# Patient Record
Sex: Male | Born: 1980 | Race: White | Hispanic: No | Marital: Married | Smoking: Never smoker
Health system: Southern US, Community
[De-identification: ages and names within clinical notes are randomized; demographics above are authoritative.]

## PROBLEM LIST (undated history)

## (undated) DIAGNOSIS — I499 Cardiac arrhythmia, unspecified: Secondary | ICD-10-CM

## (undated) DIAGNOSIS — I471 Supraventricular tachycardia, unspecified: Secondary | ICD-10-CM

## (undated) DIAGNOSIS — E785 Hyperlipidemia, unspecified: Secondary | ICD-10-CM

## (undated) HISTORY — DX: Cardiac arrhythmia, unspecified: I49.9

## (undated) HISTORY — PX: SVT ABLATION: EP1225

## (undated) HISTORY — DX: Supraventricular tachycardia, unspecified: I47.10

## (undated) HISTORY — DX: Hyperlipidemia, unspecified: E78.5

---

## 2009-08-08 ENCOUNTER — Observation Stay: Payer: Self-pay | Admitting: Internal Medicine

## 2013-06-28 ENCOUNTER — Ambulatory Visit: Payer: Self-pay | Admitting: Physician Assistant

## 2014-10-28 IMAGING — US US GALLBLADDER-BILIARY (RUQ)
1 series · 14 of 25 positions shown · non-contrast
Comparison: None

CLINICAL DATA: Abdominal pain, attention gallbladder

EXAM:
US ABDOMEN LIMITED - RIGHT UPPER QUADRANT

[Series 1: us gallbladder-biliary (ruq) · 0.31mm/px · 14 of 45 slices shown]
[im 1/45]
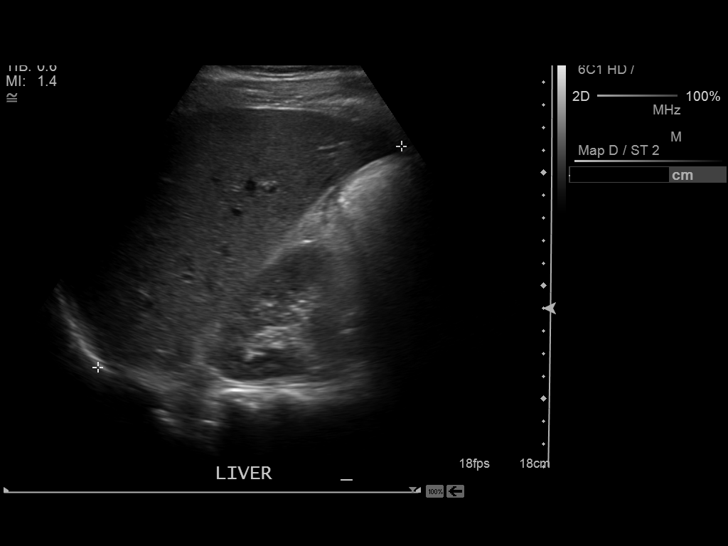
[im 4/45]
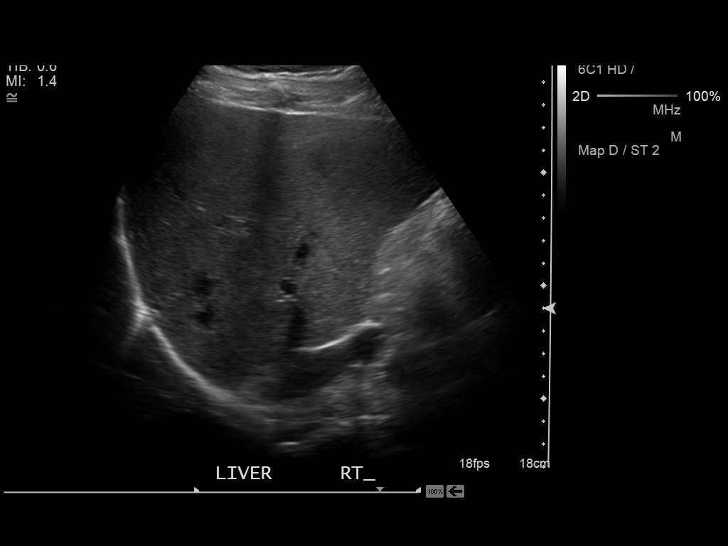
[im 8/45]
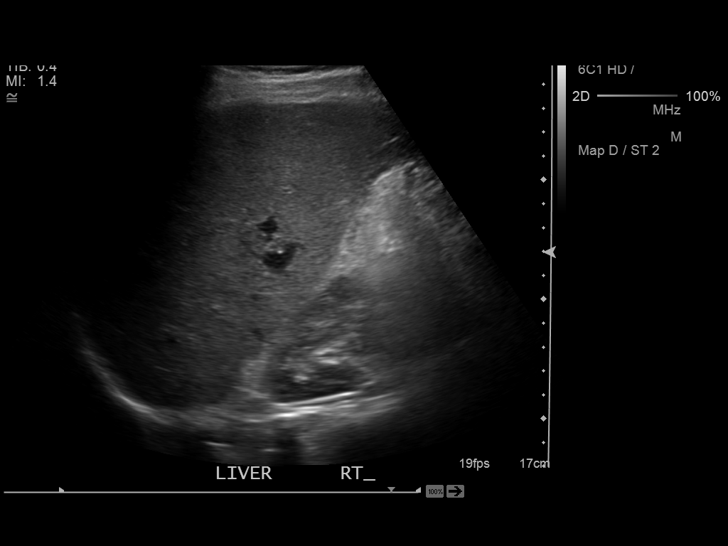
[im 12/45]
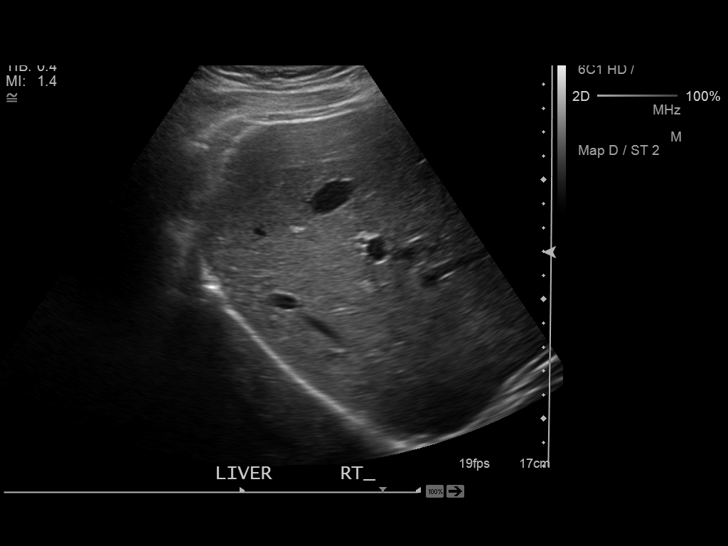
[im 15/45]
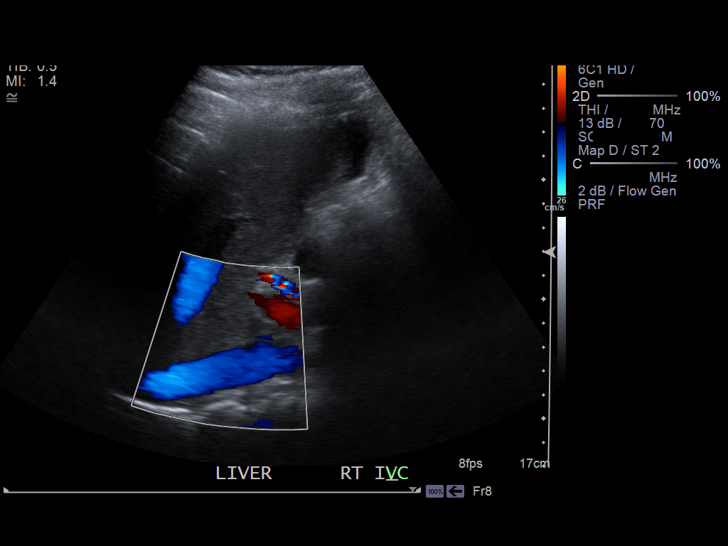
[im 17/45]
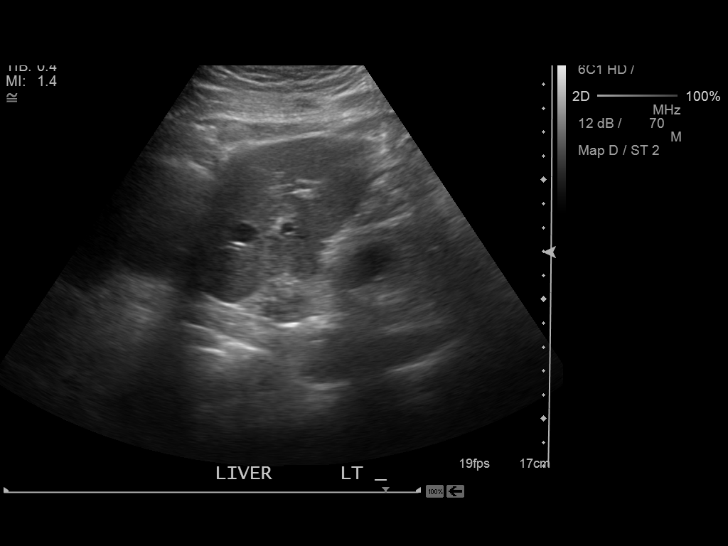
[im 21/45]
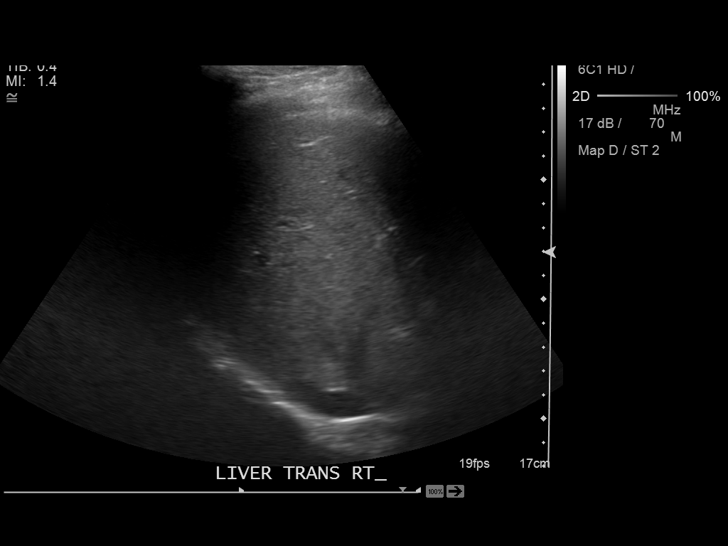
[im 24/45]
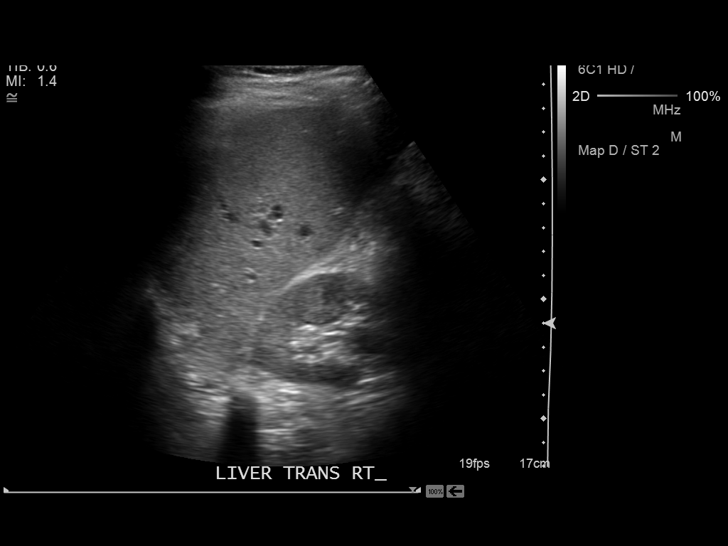
[im 28/45]
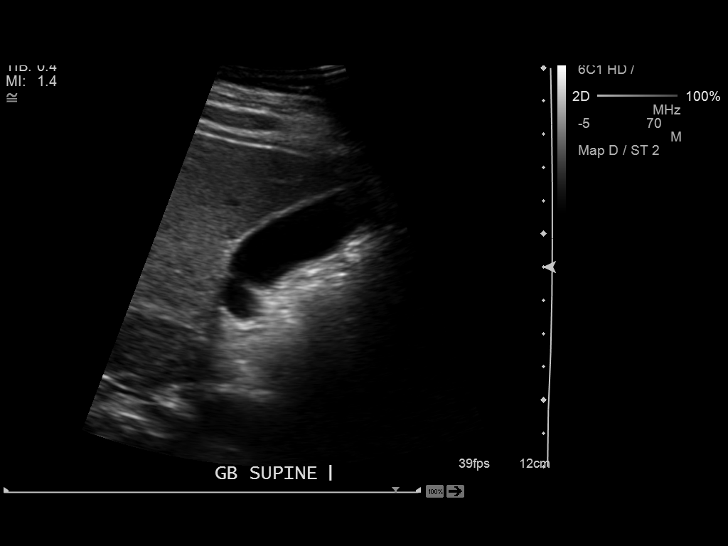
[im 30/45]
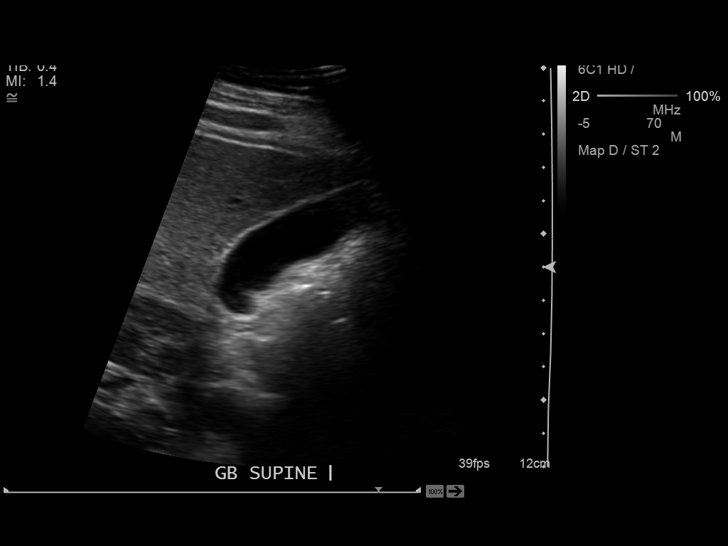
[im 34/45]
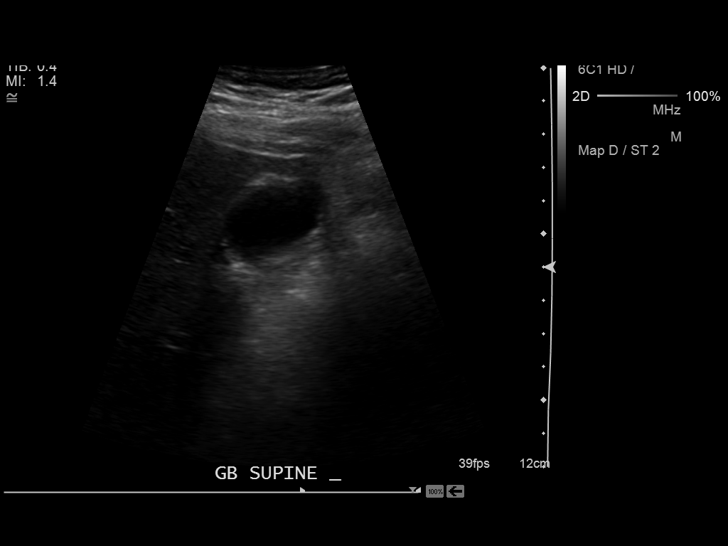
[im 37/45]
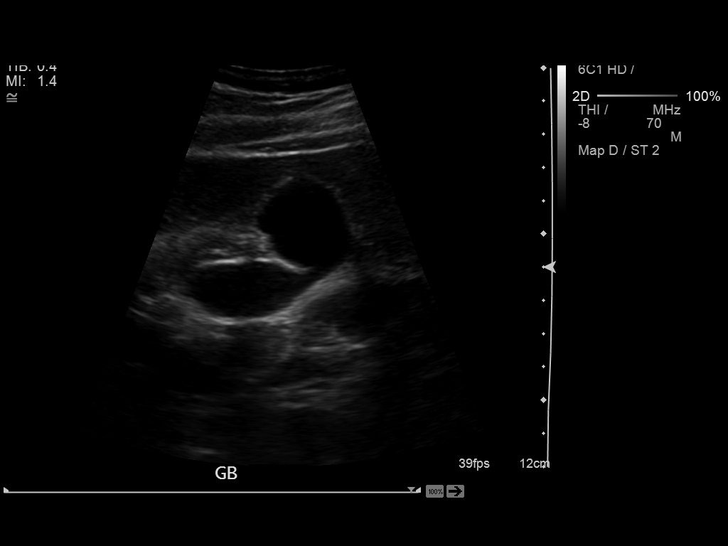
[im 41/45]
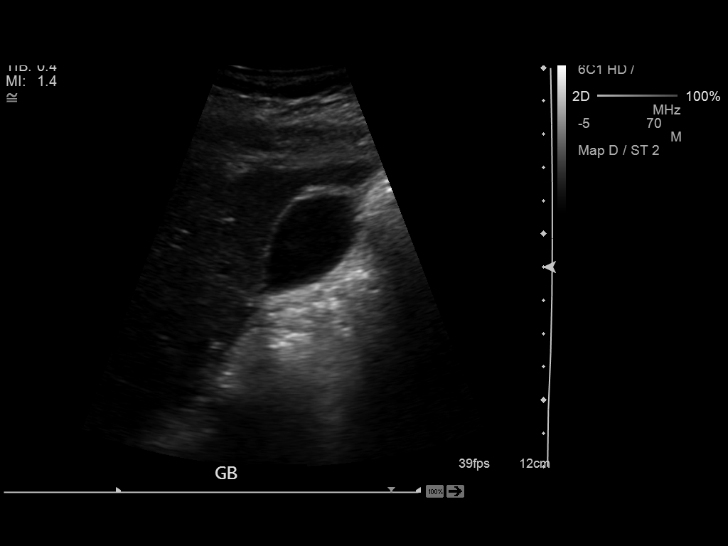
[im 45/45]
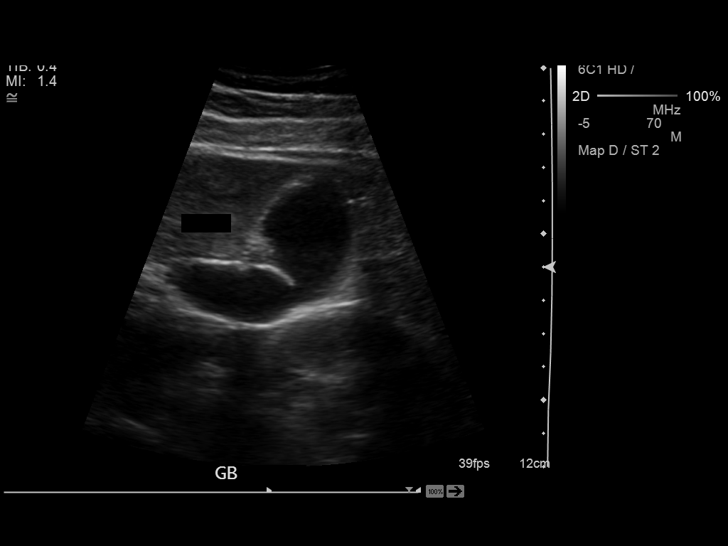

[14 of 25 positions shown; findings below may reference images not displayed]

FINDINGS: Gallbladder:

Normally distended without stones or wall thickening.

No pericholecystic fluid or sonographic Murphy sign.

Common bile duct:

Diameter: Normal caliber 3 mm diameter

Liver:

Normal appearance

No right upper quadrant free fluid.
IMPRESSION: Normal right upper quadrant ultrasound.

## 2018-11-05 ENCOUNTER — Other Ambulatory Visit: Payer: Self-pay

## 2018-11-05 DIAGNOSIS — Z20822 Contact with and (suspected) exposure to covid-19: Secondary | ICD-10-CM

## 2018-11-06 LAB — NOVEL CORONAVIRUS, NAA: SARS-CoV-2, NAA: NOT DETECTED

## 2019-01-09 ENCOUNTER — Other Ambulatory Visit: Payer: Self-pay

## 2019-01-09 DIAGNOSIS — Z20822 Contact with and (suspected) exposure to covid-19: Secondary | ICD-10-CM

## 2019-01-10 LAB — NOVEL CORONAVIRUS, NAA: SARS-CoV-2, NAA: NOT DETECTED

## 2019-06-02 ENCOUNTER — Other Ambulatory Visit: Payer: Self-pay

## 2019-06-02 ENCOUNTER — Ambulatory Visit: Payer: Self-pay | Attending: Internal Medicine

## 2019-06-02 DIAGNOSIS — Z23 Encounter for immunization: Secondary | ICD-10-CM

## 2019-06-02 NOTE — Progress Notes (Signed)
   Covid-19 Vaccination Clinic  Name:  Johnathan Hart    MRN: [id number] DOB: [birth year]  06/02/2019  Johnathan Hart was observed post Covid-19 immunization for 15 minutes without incident. He was provided with Vaccine Information Sheet and instruction to access the V-Safe system.   Johnathan Hart was instructed to call 911 with any severe reactions post vaccine: Marland Kitchen Difficulty breathing  . Swelling of face and throat  . A fast heartbeat  . A bad rash all over body  . Dizziness and weakness   Immunizations Administered    Name Date Dose VIS Date Route   Pfizer COVID-19 Vaccine 06/02/2019  3:50 PM 0.3 mL 02/24/2019 Intramuscular   Manufacturer: [employer], [employer]   Lot: [id number]   NDC: [address]-4

## 2019-06-23 ENCOUNTER — Ambulatory Visit: Payer: Self-pay | Attending: Internal Medicine

## 2019-06-23 DIAGNOSIS — Z23 Encounter for immunization: Secondary | ICD-10-CM

## 2019-06-23 NOTE — Progress Notes (Signed)
   Covid-19 Vaccination Clinic  Name:  Johnathan Hart    MRN: [id number] DOB: 01-Apr-1980  06/23/2019  Mr. Johnathan Hart was observed post Covid-19 immunization for 15 minutes without incident. He was provided with Vaccine Information Sheet and instruction to access the V-Safe system.   Mr. Johnathan Hart was instructed to call 911 with any severe reactions post vaccine: Marland Kitchen Difficulty breathing  . Swelling of face and throat  . A fast heartbeat  . A bad rash all over body  . Dizziness and weakness   Immunizations Administered    Name Date Dose VIS Date Route   Pfizer COVID-19 Vaccine 06/23/2019  4:06 PM 0.3 mL 02/24/2019 Intramuscular   Manufacturer: [employer], [employer]   Lot: [id number]   NDC: [address]-0

## 2020-02-05 ENCOUNTER — Ambulatory Visit: Payer: Self-pay | Attending: Internal Medicine

## 2020-02-05 DIAGNOSIS — Z23 Encounter for immunization: Secondary | ICD-10-CM

## 2020-02-05 NOTE — Progress Notes (Signed)
   Covid-19 Vaccination Clinic  Name:  Johnathan Hart    MRN: [id number] DOB: 01-16-1981  02/05/2020  [name] was observed post Covid-19 immunization for 15 minutes without incident. He was provided with Vaccine Information Sheet and instruction to access the V-Safe system.   [name] was instructed to call 911 with any severe reactions post vaccine: Marland Kitchen Difficulty breathing  . Swelling of face and throat  . A fast heartbeat  . A bad rash all over body  . Dizziness and weakness   Immunizations Administered    No immunizations on file.

## 2021-11-25 DIAGNOSIS — R7303 Prediabetes: Secondary | ICD-10-CM | POA: Insufficient documentation

## 2021-11-25 DIAGNOSIS — E785 Hyperlipidemia, unspecified: Secondary | ICD-10-CM | POA: Insufficient documentation

## 2021-11-26 ENCOUNTER — Other Ambulatory Visit: Payer: Self-pay | Admitting: Family Medicine

## 2021-11-26 DIAGNOSIS — E785 Hyperlipidemia, unspecified: Secondary | ICD-10-CM

## 2021-12-05 ENCOUNTER — Ambulatory Visit
Admission: RE | Admit: 2021-12-05 | Discharge: 2021-12-05 | Disposition: A | Discharge status: Home / Self Care | Payer: Self-pay | Source: Ambulatory Visit | Attending: Family Medicine | Admitting: Family Medicine

## 2021-12-05 DIAGNOSIS — E785 Hyperlipidemia, unspecified: Secondary | ICD-10-CM | POA: Insufficient documentation

## 2022-04-14 NOTE — Progress Notes (Unsigned)
Electrophysiology Office Note:    Date:  04/15/2022   ID:  Johnathan Hart, DOB March 23, 1980, MRN [id number]  PCP:  Johnathan Pink, MD  [facility] Cardiologist:  None  [facility] Electrophysiologist:  Johnathan Epley, MD   Referring MD: Johnathan Pink, MD   Chief Complaint: SVT  History of Present Illness:    Johnathan Hart is a 42 y.o. male who presents for an evaluation of SVT at the request of Dr Johnathan Hart. Their medical history includes SVT (ORT s/p ablation of AP at 3 o'clock on MV annulus at [facility] in May 2011 and redo in June 2011) and migraine headaches.  Today he presents to establish care with our practice.   His episode was for starting college.  He became more frequent.  He had a heart monitor on Monday when he experienced an episode running.  This prompted evaluation at [facility] and ultimately his first ablation.  After the ablation his episodes action became more intense and frequent.  He remembers having an episode while in the recovery bay after his initial ablation.  He ultimately underwent a second ablation which was successful.  He then went over 10 years without an episode of arrhythmia and then more recently he has noticed episodes of palpitations.  These episodes are much shorter and last only 1 to 2 minutes.  They have been occurring between 10 PM and 1 AM.  Interestingly, his father has a diagnosis of atrial fibrillation that was made in his late 33s early 88s after being struck by lightning.    Past Medical History:  Diagnosis Date   Arrhythmia    Hyperlipidemia    SVT (supraventricular tachycardia)     Past Surgical History:  Procedure Laterality Date   SVT ABLATION      Current Medications: Current Meds  Medication Sig   EPINEPHrine (EPIPEN 2-PAK) 0.3 mg/0.3 mL IJ SOAJ injection Inject 0.3 mg into the muscle as needed for anaphylaxis.   fluticasone (FLONASE) 50 MCG/ACT nasal spray Place 1 spray into both nostrils 2 (two) times daily.   loratadine (CLARITIN)  10 MG tablet Take 1 tablet by mouth daily.   Multiple Vitamin (MULTIVITAMIN) capsule Take 1 capsule by mouth daily.     Allergies:   Minocycline and Pineapple   Social History   Socioeconomic History   Marital status: Married    Spouse name: Not on file   Number of children: Not on file   Years of education: Not on file   Highest education level: Not on file  Occupational History   Not on file  Tobacco Use   Smoking status: Never   Smokeless tobacco: Never  Vaping Use   Vaping Use: Never used  Substance and Sexual Activity   Alcohol use: Never   Drug use: Never   Sexual activity: Yes  Other Topics Concern   Not on file  Social History Narrative   Not on file   Social Determinants of Health   Financial Resource Strain: Not on file  Food Insecurity: Not on file  Transportation Needs: Not on file  Physical Activity: Not on file  Stress: Not on file  Social Connections: Not on file     Family History: The patient's family history includes Dementia in his mother.  ROS:   Please see the history of present illness.    All other systems reviewed and are negative.  EKGs/Labs/Other Studies Reviewed:    The following studies were reviewed today:  11/2021 CT Calcium score - 0  EKG:  The ekg ordered today demonstrates sinus rhythm, nonspecific interventricular conduction delay with QRS duration of 146 ms   Recent Labs: No results found for requested labs within last 365 days.  Recent Lipid Panel No results found for: "CHOL", "TRIG", "HDL", "CHOLHDL", "VLDL", "LDLCALC", "LDLDIRECT"  Physical Exam:    VS:  BP 110/76   Pulse 87   Ht 5\' 8"  (1.727 m)   Wt 204 lb (92.5 kg)   SpO2 98%   BMI 31.02 kg/m     Wt Readings from Last 3 Encounters:  04/15/22 204 lb (92.5 kg)     GEN:  Well nourished, well developed in no acute distress CARDIAC: RRR, no murmurs, rubs, gallops RESPIRATORY:  Clear to auscultation without rales, wheezing or rhonchi  PSYCHIATRIC:  Normal  affect       ASSESSMENT:    1. SVT (supraventricular tachycardia)    PLAN:    In order of problems listed above:  #SVT S/p 2 prior ablations of a left lateral AP at [facility] in 2011. Now with more brief episodes at night.  Unclear if these represent an ORT again or if this could be a new arrhythmia such as atrial fibrillation especially with his father's early onset arrhythmia.  I would like to start with a 2-week ZIO monitor to see if we can capture an episode.  If this is not revealing, would plan for a loop recorder implant for ongoing rhythm surveillance.  Plan to follow-up in 6 to 8 weeks after he is wearing the ZIO monitor.   Total time of encounter: 45 minutes total time of encounter, including face-to-face patient care, coordination of care and counseling regarding high complexity medical decision making re: SVT.  Medication Adjustments/Labs and Tests Ordered: Current medicines are reviewed at length with the patient today.  Concerns regarding medicines are outlined above.  No orders of the defined types were placed in this encounter.  No orders of the defined types were placed in this encounter.    Signed, Hilton Cork. Quentin Ore, MD, [facility], [facility] 04/15/2022 2:18 PM    Electrophysiology [facility]

## 2022-04-15 ENCOUNTER — Ambulatory Visit: Payer: Managed Care, Other (non HMO)

## 2022-04-15 ENCOUNTER — Ambulatory Visit: Payer: Managed Care, Other (non HMO) | Attending: Cardiology | Admitting: Cardiology

## 2022-04-15 ENCOUNTER — Encounter: Payer: Self-pay | Admitting: Cardiology

## 2022-04-15 VITALS — BP 110/76 | HR 87 | Ht 68.0 in | Wt 204.0 lb

## 2022-04-15 DIAGNOSIS — I471 Supraventricular tachycardia, unspecified: Secondary | ICD-10-CM

## 2022-04-15 NOTE — Patient Instructions (Addendum)
Medication Instructions:  Your physician recommends that you continue on your current medications as directed. Please refer to the Current Medication list given to you today.  *If you need a refill on your cardiac medications before your next appointment, please call your pharmacy*  Testing/Procedures: Your physician has recommended that you wear an event monitor. Event monitors are medical devices that record the heart's electrical activity. Doctors most often us these monitors to diagnose arrhythmias. Arrhythmias are problems with the speed or rhythm of the heartbeat. The monitor is a small, portable device. You can wear one while you do your normal daily activities. This is usually used to diagnose what is causing palpitations/syncope (passing out).  Follow-Up: At [facility], you and your health needs are our priority.  As part of our continuing mission to provide you with exceptional heart care, we have created designated Provider Care Teams.  These Care Teams include your primary Cardiologist (physician) and Advanced Practice Providers (APPs -  Physician Assistants and Nurse Practitioners) who all work together to provide you with the care you need, when you need it.  Your next appointment:   6-8 week(s)  Provider:   Cameron Lambert, MD    Other Instructions ZIO XT- Long Term Monitor Instructions  Your physician has requested you wear a ZIO patch monitor for 14 days.  This is a single patch monitor. [employer] supplies one patch monitor per enrollment. Additional stickers are not available. Please do not apply patch if you will be having a Nuclear Stress Test,  Echocardiogram, Cardiac CT, MRI, or Chest Xray during the period you would be wearing the  monitor. The patch cannot be worn during these tests. You cannot remove and re-apply the  ZIO XT patch monitor.  Your ZIO patch monitor will be mailed 3 day USPS to your address on file. It may take 3-5 days  to receive your  monitor after you have been enrolled.  Once you have received your monitor, please review the enclosed instructions. Your monitor  has already been registered assigning a specific monitor serial # to you.  Billing and Patient Assistance Program Information  We have supplied [employer] with any of your insurance information on file for billing purposes. [employer] offers a sliding scale Patient Assistance Program for patients that do not have  insurance, or whose insurance does not completely cover the cost of the ZIO monitor.  You must apply for the Patient Assistance Program to qualify for this discounted rate.  To apply, please call [employer] at [phone], select option 4, select option 2, ask to apply for  Patient Assistance Program. [employer] will ask your household income, and how many people  are in your household. They will quote your out-of-pocket cost based on that information.  [employer] will also be able to set up a 12-month, interest-free payment plan if needed.  Applying the monitor   Shave hair from upper left chest.  Hold abrader disc by orange tab. Rub abrader in 40 strokes over the upper left chest as  indicated in your monitor instructions.  Clean area with 4 enclosed alcohol pads. Let dry.  Apply patch as indicated in monitor instructions. Patch will be placed under collarbone on left  side of chest with arrow pointing upward.  Rub patch adhesive wings for 2 minutes. Remove white label marked "1". Remove the white  label marked "2". Rub patch adhesive wings for 2 additional minutes.  While looking in a mirror, press and release button in center of patch.   A small green light will  flash 3-4 times. This will be your only indicator that the monitor has been turned on.  Do not shower for the first 24 hours. You may shower after the first 24 hours.  Press the button if you feel a symptom. You will hear a small click. Record Date, Time and  Symptom in the Patient Logbook.  When you  are ready to remove the patch, follow instructions on the last 2 pages of Patient  Logbook. Stick patch monitor onto the last page of Patient Logbook.  Place Patient Logbook in the [place] and white box. Use locking tab on box and tape box closed  securely. The [place] and white box has prepaid postage on it. Please place it in the mailbox as  soon as possible. Your physician should have your test results approximately 7 days after the  monitor has been mailed back to [employer].  Call [employer] Customer Care at [phone] if you have questions regarding  your ZIO XT patch monitor. Call them immediately if you see an orange light blinking on your  monitor.  If your monitor falls off in less than 4 days, contact our Monitor department at [phone].  If your monitor becomes loose or falls off after 4 days call [employer] at [phone] for  suggestions on securing your monitor   

## 2022-04-17 DIAGNOSIS — I471 Supraventricular tachycardia, unspecified: Secondary | ICD-10-CM | POA: Diagnosis not present

## 2022-05-26 NOTE — Progress Notes (Unsigned)
Electrophysiology Office Follow up Visit Note:    Date:  05/27/2022   ID:  Johnathan Hart, DOB June 25, 1980, MRN [id number]  PCP:  [place] Pink, MD  [facility] Cardiologist:  None  [facility] Electrophysiologist:  Vickie Epley, MD    Interval History:    Johnathan Hart is a 42 y.o. male who presents for a follow up visit.   I last saw the patient April 15, 2022 for SVT.  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  The patient reported palpitations at night during her last appointment.  I ordered a 2-week ZIO monitor at the last appointment.  He is doing well today.  He tells me he continues to have episodes of palpitations and SVT.  Sometimes he has to gag himself to get the SVT to stop.  He does tell me that they are slightly different than the SVT that led to his prior ablation greater than 10 years ago at [facility].     Past medical, surgical, social and family history were reviewed.  ROS:   Please see the history of present illness.    All other systems reviewed and are negative.  EKGs/Labs/Other Studies Reviewed:    The following studies were reviewed today:  May 12, 2022 ZIO monitor personally reviewed 15 SVT episodes, longest 58 seconds with an average heart rate of 178 bpm.  Symptom triggers corresponded to SVT episodes     Physical Exam:    VS:  BP 110/62   Pulse 96   Ht '5\' 8"'$  (1.727 m)   Wt 208 lb (94.3 kg)   BMI 31.63 kg/m     Wt Readings from Last 3 Encounters:  05/27/22 208 lb (94.3 kg)  04/15/22 204 lb (92.5 kg)     GEN:  Well nourished, well developed in no acute distress CARDIAC: RRR, no murmurs, rubs, gallops RESPIRATORY:  Clear to auscultation without rales, wheezing or rhonchi       ASSESSMENT:    1. SVT (supraventricular tachycardia)    PLAN:    In order of problems listed above:  #SVT The patient is had 2 prior SVT ablations at [facility] for ORT with a left  lateral pathway targeted.  He has had recurrence of NSVT on his most recent ZIO monitor.  We discussed the possibility that this was the same pathway that had recurred or a different SVT altogether.  We discussed how the only way to know for sure would be to do an EP study.  We also discussed using flecainide or propafenone to help control the arrhythmia.  We also discussed continue with conservative strategy with watchful waiting.  He is not interested in waiting given the frequent symptoms.  He is also not interested in prolonged medication use given his relatively young age.  I discussed the EP study and ablation procedure in detail with the patient including the risks, recovery and likelihood of success.  He would like to proceed with scheduling.  He will need an echo before the procedure.  Risk, benefits, and alternatives to EP study and radiofrequency ablation for SVT were also discussed in detail today. These risks include but are not limited to complete heart block, stroke, bleeding, vascular damage, tamponade, perforation, and death. The patient understands these risk and wishes to proceed.  We will therefore proceed with catheter ablation at the next available time.  Carto and ICE and anaesthesia are requested for the procedure.  Signed, Lars Mage, MD, [facility], [facility] 05/27/2022 2:26 PM    Electrophysiology [facility]

## 2022-05-27 ENCOUNTER — Other Ambulatory Visit: Payer: Self-pay

## 2022-05-27 ENCOUNTER — Ambulatory Visit: Payer: Managed Care, Other (non HMO) | Attending: Cardiology | Admitting: Cardiology

## 2022-05-27 ENCOUNTER — Encounter: Payer: Self-pay | Admitting: Cardiology

## 2022-05-27 VITALS — BP 110/62 | HR 96 | Ht 68.0 in | Wt 208.0 lb

## 2022-05-27 DIAGNOSIS — I471 Supraventricular tachycardia, unspecified: Secondary | ICD-10-CM | POA: Diagnosis not present

## 2022-05-27 NOTE — Addendum Note (Signed)
Addended by: Bernestine Amass on: 05/27/2022 02:45 PM   Modules accepted: Orders

## 2022-05-27 NOTE — Patient Instructions (Signed)
Medication Instructions:  Your physician recommends that you continue on your current medications as directed. Please refer to the Current Medication list given to you today.  *If you need a refill on your cardiac medications before your next appointment, please call your pharmacy*  Lab Work: BMET and CBC prior to ablation You will get your lab work at [facility]) hospital.  Your lab work will be done at the [facility] next to [employer]. These are walk in labs- you will not need an appointment and you do not need to be fasting.    Testing/Procedures: Your physician has requested that you have an echocardiogram. Echocardiography is a painless test that uses sound waves to create images of your heart. It provides your doctor with information about the size and shape of your heart and how well your heart's chambers and valves are working. This procedure takes approximately one hour. There are no restrictions for this procedure. Please do NOT wear cologne, perfume, aftershave, or lotions (deodorant is allowed). Please arrive 15 minutes prior to your appointment time.  Your physician has recommended that you have an ablation. Catheter ablation is a medical procedure used to treat some cardiac arrhythmias (irregular heartbeats). During catheter ablation, a long, thin, flexible tube is put into a blood vessel in your groin (upper thigh), or neck. This tube is called an ablation catheter. It is then guided to your heart through the blood vessel. Radio frequency waves destroy small areas of heart tissue where abnormal heartbeats may cause an arrhythmia to start. Please see the instruction sheet given to you today.  Follow-Up: At [facility], you and your health needs are our priority.  As part of our continuing mission to provide you with exceptional heart care, we have created designated Provider Care Teams.  These Care Teams include your primary Cardiologist (physician) and  Advanced Practice Providers (APPs -  Physician Assistants and Nurse Practitioners) who all work together to provide you with the care you need, when you need it.  Your next appointment:   4 weeks after your ablation with Dr. Quentin Ore

## 2022-07-02 ENCOUNTER — Ambulatory Visit: Payer: Managed Care, Other (non HMO) | Attending: Cardiology

## 2022-07-02 DIAGNOSIS — I471 Supraventricular tachycardia, unspecified: Secondary | ICD-10-CM | POA: Diagnosis not present

## 2022-07-02 LAB — ECHOCARDIOGRAM COMPLETE
AR max vel: 2.48 cm2
AV Area VTI: 2.26 cm2
AV Area mean vel: 2.37 cm2
AV Mean grad: 3 mmHg
AV Peak grad: 4.9 mmHg
Ao pk vel: 1.11 m/s
Area-P 1/2: 4.89 cm2
Calc EF: 57.5 %
S' Lateral: 3.3 cm
Single Plane A2C EF: 55 %
Single Plane A4C EF: 57.5 %

## 2022-07-02 MED ORDER — PERFLUTREN LIPID MICROSPHERE
1.0000 mL | INTRAVENOUS | Status: AC | PRN
  Administered 2022-07-02: 2 mL via INTRAVENOUS

## 2022-07-07 ENCOUNTER — Telehealth: Payer: Self-pay

## 2022-07-07 DIAGNOSIS — I519 Heart disease, unspecified: Secondary | ICD-10-CM

## 2022-07-07 NOTE — Telephone Encounter (Signed)
Patient reviewed results in MyChart. Referral has been placed for patient to see Dr. Flora Lipps.

## 2022-07-07 NOTE — Telephone Encounter (Signed)
-----   Message from Lanier Prude, MD sent at 07/07/2022  3:00 PM EDT ----- Echo reviewed. There is a mildly reduced pumping function of the heart. I'd like you get in to a general cardiologist to discuss management options for this reduced ejection fraction. This can be done in parallel with our management of your arrhythmia.  Carly, can you get him set up with Dr Flora Lipps for general cardiology care?  Sheria Lang T. Lalla Brothers, MD, [facility], [facility] Electrophysiology

## 2022-07-21 LAB — CBC WITH DIFFERENTIAL/PLATELET
Basophils Absolute: 0 10*3/uL (ref 0.0–0.2)
Basos: 1 %
EOS (ABSOLUTE): 0.1 10*3/uL (ref 0.0–0.4)
Eos: 2 %
Hematocrit: 41.3 % (ref 37.5–51.0)
Hemoglobin: 14.1 g/dL (ref 13.0–17.7)
Immature Grans (Abs): 0 10*3/uL (ref 0.0–0.1)
Immature Granulocytes: 0 %
Lymphocytes Absolute: 1.6 10*3/uL (ref 0.7–3.1)
Lymphs: 35 %
MCH: 31.3 pg (ref 26.6–33.0)
MCHC: 34.1 g/dL (ref 31.5–35.7)
MCV: 92 fL (ref 79–97)
Monocytes Absolute: 0.4 10*3/uL (ref 0.1–0.9)
Monocytes: 9 %
Neutrophils Absolute: 2.6 10*3/uL (ref 1.4–7.0)
Neutrophils: 53 %
Platelets: 216 10*3/uL (ref 150–450)
RBC: 4.51 x10E6/uL (ref 4.14–5.80)
RDW: 13 % (ref 11.6–15.4)
WBC: 4.7 10*3/uL (ref 3.4–10.8)

## 2022-07-21 LAB — BASIC METABOLIC PANEL
BUN/Creatinine Ratio: 12 (ref 9–20)
BUN: 11 mg/dL (ref 6–24)
CO2: 25 mmol/L (ref 20–29)
Calcium: 9 mg/dL (ref 8.7–10.2)
Chloride: 102 mmol/L (ref 96–106)
Creatinine, Ser: 0.92 mg/dL (ref 0.76–1.27)
Glucose: 100 mg/dL — ABNORMAL HIGH (ref 70–99)
Potassium: 4.2 mmol/L (ref 3.5–5.2)
Sodium: 140 mmol/L (ref 134–144)
eGFR: 106 mL/min/{1.73_m2} (ref >59)

## 2022-08-03 ENCOUNTER — Telehealth: Payer: Self-pay | Admitting: Cardiology

## 2022-08-03 NOTE — Telephone Encounter (Signed)
Spoke with the patient and reviewed instructions for his ablation. Patient verbalized understanding.  

## 2022-08-03 NOTE — Telephone Encounter (Signed)
Pt calling for instructions prior to his ablation this Friday

## 2022-08-06 NOTE — Pre-Procedure Instructions (Signed)
Instructed patient on the following items: Arrival time 1130 Nothing to eat or drink after midnight No meds AM of procedure Responsible person to drive you home and stay with you for 24 hrs   

## 2022-08-07 ENCOUNTER — Other Ambulatory Visit: Payer: Self-pay

## 2022-08-07 ENCOUNTER — Ambulatory Visit (HOSPITAL_COMMUNITY): Payer: Managed Care, Other (non HMO) | Admitting: Anesthesiology

## 2022-08-07 ENCOUNTER — Encounter (HOSPITAL_COMMUNITY)
Admission: RE | Disposition: A | Discharge status: Home / Self Care | Payer: Self-pay | Source: Home / Self Care | Attending: Cardiology

## 2022-08-07 ENCOUNTER — Ambulatory Visit (HOSPITAL_BASED_OUTPATIENT_CLINIC_OR_DEPARTMENT_OTHER): Payer: Managed Care, Other (non HMO) | Admitting: Anesthesiology

## 2022-08-07 ENCOUNTER — Ambulatory Visit (HOSPITAL_COMMUNITY)
Admission: RE | Admit: 2022-08-07 | Discharge: 2022-08-07 | Disposition: A | Discharge status: Home / Self Care | Payer: Managed Care, Other (non HMO) | Attending: Cardiology | Admitting: Cardiology

## 2022-08-07 DIAGNOSIS — E785 Hyperlipidemia, unspecified: Secondary | ICD-10-CM

## 2022-08-07 DIAGNOSIS — R7303 Prediabetes: Secondary | ICD-10-CM

## 2022-08-07 DIAGNOSIS — I471 Supraventricular tachycardia, unspecified: Secondary | ICD-10-CM

## 2022-08-07 HISTORY — PX: SVT ABLATION: EP1225

## 2022-08-07 SURGERY — SVT ABLATION
Anesthesia: General

## 2022-08-07 MED ORDER — LIDOCAINE 2% (20 MG/ML) 5 ML SYRINGE
INTRAMUSCULAR | Status: DC | PRN
  Administered 2022-08-07: 60 mg via INTRAVENOUS

## 2022-08-07 MED ORDER — PROPOFOL 10 MG/ML IV BOLUS
INTRAVENOUS | Status: DC | PRN
  Administered 2022-08-07: 200 mg via INTRAVENOUS

## 2022-08-07 MED ORDER — HEPARIN SODIUM (PORCINE) 1000 UNIT/ML IJ SOLN
INTRAMUSCULAR | Status: AC
  Filled 2022-08-07: qty 10

## 2022-08-07 MED ORDER — PHENYLEPHRINE HCL-NACL 20-0.9 MG/250ML-% IV SOLN
INTRAVENOUS | Status: DC | PRN
  Administered 2022-08-07: 20 ug/min via INTRAVENOUS

## 2022-08-07 MED ORDER — ONDANSETRON HCL 4 MG/2ML IJ SOLN: 4.0000 mg | Freq: Four times a day (QID) | INTRAMUSCULAR | Status: DC | PRN

## 2022-08-07 MED ORDER — DEXAMETHASONE SODIUM PHOSPHATE 10 MG/ML IJ SOLN
INTRAMUSCULAR | Status: DC | PRN
  Administered 2022-08-07: 10 mg via INTRAVENOUS

## 2022-08-07 MED ORDER — SODIUM CHLORIDE 0.9% FLUSH: 3.0000 mL | INTRAVENOUS | Status: DC | PRN

## 2022-08-07 MED ORDER — FENTANYL CITRATE (PF) 250 MCG/5ML IJ SOLN
INTRAMUSCULAR | Status: DC | PRN
  Administered 2022-08-07: 50 ug via INTRAVENOUS
  Administered 2022-08-07: 100 ug via INTRAVENOUS
  Administered 2022-08-07: 50 ug via INTRAVENOUS

## 2022-08-07 MED ORDER — ONDANSETRON HCL 4 MG/2ML IJ SOLN
INTRAMUSCULAR | Status: DC | PRN
  Administered 2022-08-07: 4 mg via INTRAVENOUS

## 2022-08-07 MED ORDER — ISOPROTERENOL HCL 0.2 MG/ML IJ SOLN
INTRAVENOUS | Status: DC | PRN
  Administered 2022-08-07: 2 ug/min via INTRAVENOUS

## 2022-08-07 MED ORDER — ROCURONIUM BROMIDE 10 MG/ML (PF) SYRINGE
PREFILLED_SYRINGE | INTRAVENOUS | Status: DC | PRN
  Administered 2022-08-07: 30 mg via INTRAVENOUS
  Administered 2022-08-07: 50 mg via INTRAVENOUS

## 2022-08-07 MED ORDER — HEPARIN SODIUM (PORCINE) 1000 UNIT/ML IJ SOLN
INTRAMUSCULAR | Status: DC | PRN
  Administered 2022-08-07: 1000 [IU] via INTRAVENOUS

## 2022-08-07 MED ORDER — SODIUM CHLORIDE 0.9 % IV SOLN: 250.0000 mL | INTRAVENOUS | Status: DC | PRN

## 2022-08-07 MED ORDER — HEPARIN (PORCINE) IN NACL 1000-0.9 UT/500ML-% IV SOLN
INTRAVENOUS | Status: DC | PRN
  Administered 2022-08-07 (×2): 500 mL

## 2022-08-07 MED ORDER — PHENYLEPHRINE 80 MCG/ML (10ML) SYRINGE FOR IV PUSH (FOR BLOOD PRESSURE SUPPORT)
PREFILLED_SYRINGE | INTRAVENOUS | Status: DC | PRN
  Administered 2022-08-07 (×2): 80 ug via INTRAVENOUS

## 2022-08-07 MED ORDER — SODIUM CHLORIDE 0.9% FLUSH: 3.0000 mL | Freq: Two times a day (BID) | INTRAVENOUS | Status: DC

## 2022-08-07 MED ORDER — MIDAZOLAM HCL 2 MG/2ML IJ SOLN
INTRAMUSCULAR | Status: DC | PRN
  Administered 2022-08-07 (×2): 1 mg via INTRAVENOUS

## 2022-08-07 MED ORDER — SODIUM CHLORIDE 0.9 % IV SOLN: INTRAVENOUS | Status: DC

## 2022-08-07 MED ORDER — ACETAMINOPHEN 325 MG PO TABS: 650.0000 mg | ORAL_TABLET | ORAL | Status: DC | PRN

## 2022-08-07 MED ORDER — ISOPROTERENOL HCL 0.2 MG/ML IJ SOLN
INTRAMUSCULAR | Status: AC
  Filled 2022-08-07: qty 5

## 2022-08-07 SURGICAL SUPPLY — 23 items
BAG SNAP BAND KOVER 36X36 (MISCELLANEOUS) IMPLANT
BLANKET WARM UNDERBOD FULL ACC (MISCELLANEOUS) IMPLANT
CATH ABLAT QDOT MICRO BI TC DF (CATHETERS) IMPLANT
CATH CRD2 QUAD 6FR REP (CATHETERS) IMPLANT
CATH JOSEPH QUAD ALLRED 6F REP (CATHETERS) IMPLANT
CATH OCTARAY 2.0 F 3-3-3-3-3 (CATHETERS) IMPLANT
CATH SOUNDSTAR ECO 8FR (CATHETERS) IMPLANT
CATH WEBSTER BI DIR CS D-F CRV (CATHETERS) IMPLANT
CLOSURE PERCLOSE PROSTYLE (VASCULAR PRODUCTS) IMPLANT
COVER SWIFTLINK CONNECTOR (BAG) IMPLANT
ELECT DEFIB PAD ADLT CADENCE (PAD) IMPLANT
MAT PREVALON FULL STRYKER (MISCELLANEOUS) IMPLANT
PACK EP LATEX FREE (CUSTOM PROCEDURE TRAY) ×1
PACK EP LF (CUSTOM PROCEDURE TRAY) ×1 IMPLANT
PAD DEFIB RADIO PHYSIO CONN (PAD) ×1 IMPLANT
PATCH CARTO3 (PAD) IMPLANT
SHEATH BAYLIS TRANSSEPTAL 98CM (NEEDLE) IMPLANT
SHEATH CARTO VIZIGO SM CVD (SHEATH) IMPLANT
SHEATH PINNACLE 7F 10CM (SHEATH) IMPLANT
SHEATH PINNACLE 8F 10CM (SHEATH) IMPLANT
SHEATH PINNACLE 9F 10CM (SHEATH) IMPLANT
SHEATH PROBE COVER 6X72 (BAG) IMPLANT
TUBING SMART ABLATE COOLFLOW (TUBING) IMPLANT

## 2022-08-07 NOTE — Anesthesia Preprocedure Evaluation (Addendum)
Anesthesia Evaluation  Patient identified by MRN, date of birth, ID band Patient awake    Reviewed: Allergy & Precautions, NPO status , Patient's Chart, lab work & pertinent test results  History of Anesthesia Complications Negative for: history of anesthetic complications  Airway Mallampati: II  TM Distance: >3 FB Neck ROM: Full    Dental  (+) Dental Advisory Given   Pulmonary neg pulmonary ROS   Pulmonary exam normal breath sounds clear to auscultation       Cardiovascular (-) hypertension(-) angina (-) Past MI and (-) Cardiac Stents + dysrhythmias Supra Ventricular Tachycardia  Rhythm:Regular Rate:Normal  HLD  TTE 07/02/2022: IMPRESSIONS     1. Left ventricular ejection fraction, by estimation, is 40 to 45%. The  left ventricle has mildly decreased function. The left ventricle  demonstrates global hypokinesis. Left ventricular diastolic parameters are  indeterminate.   2. Right ventricular systolic function is normal. The right ventricular  size is normal.   3. The mitral valve is normal in structure. No evidence of mitral valve  regurgitation. No evidence of mitral stenosis.   4. The aortic valve is tricuspid. Aortic valve regurgitation is not  visualized. No aortic stenosis is present.   5. The inferior vena cava is normal in size with greater than 50%  respiratory variability, suggesting right atrial pressure of 3 mmHg.     Neuro/Psych negative neurological ROS     GI/Hepatic negative GI ROS, Neg liver ROS,,,  Endo/Other  Pre-diabetes  Renal/GU negative Renal ROS     Musculoskeletal   Abdominal   Peds  Hematology negative hematology ROS (+)   Anesthesia Other Findings   Reproductive/Obstetrics                             Anesthesia Physical Anesthesia Plan  ASA: 2  Anesthesia Plan: General   Post-op Pain Management: Minimal or no pain anticipated   Induction:  Intravenous  PONV Risk Score and Plan: 2 and Ondansetron, Dexamethasone and Treatment may vary due to age or medical condition  Airway Management Planned: Oral ETT  Additional Equipment:   Intra-op Plan:   Post-operative Plan: Extubation in OR  Informed Consent: I have reviewed the patients History and Physical, chart, labs and discussed the procedure including the risks, benefits and alternatives for the proposed anesthesia with the patient or authorized representative who has indicated his/her understanding and acceptance.     Dental advisory given  Plan Discussed with: CRNA and Anesthesiologist  Anesthesia Plan Comments: (Risks of general anesthesia discussed including, but not limited to, sore throat, hoarse voice, chipped/damaged teeth, injury to vocal cords, nausea and vomiting, allergic reactions, lung infection, heart attack, stroke, and death. All questions answered. )        Anesthesia Quick Evaluation

## 2022-08-07 NOTE — Discharge Instructions (Signed)

## 2022-08-07 NOTE — Anesthesia Postprocedure Evaluation (Signed)
Anesthesia Post Note  Patient: Johnathan Hart  Procedure(s) Performed: SVT ABLATION     Patient location during evaluation: PACU Anesthesia Type: General Level of consciousness: awake and alert and oriented Pain management: pain level controlled Vital Signs Assessment: post-procedure vital signs reviewed and stable Respiratory status: spontaneous breathing, nonlabored ventilation and respiratory function stable Cardiovascular status: blood pressure returned to baseline and stable Postop Assessment: no apparent nausea or vomiting Anesthetic complications: no   No notable events documented.  Last Vitals:  Vitals:   08/07/22 1415 08/07/22 1420  BP: 108/66 108/67  Pulse: 96 94  Resp: 14 15  Temp:    SpO2: 96% 94%    Last Pain:  Vitals:   08/07/22 1354  TempSrc: Temporal  PainSc: 0-No pain                 Daysy Santini A.

## 2022-08-07 NOTE — Transfer of Care (Signed)
Immediate Anesthesia Transfer of Care Note  Patient: Johnathan Hart  Procedure(s) Performed: SVT ABLATION  Patient Location: PACU  Anesthesia Type:General  Level of Consciousness: awake, alert , and oriented  Airway & Oxygen Therapy: Patient Spontanous Breathing and Patient connected to nasal cannula oxygen  Post-op Assessment: Report given to RN, Post -op Vital signs reviewed and stable, and Patient moving all extremities X 4  Post vital signs: Reviewed and stable  Last Vitals:  Vitals Value Taken Time  BP    Temp    Pulse    Resp    SpO2      Last Pain:  Vitals:   08/07/22 1133  TempSrc: Temporal  PainSc: 0-No pain         Complications: No notable events documented.

## 2022-08-07 NOTE — Progress Notes (Signed)
Patient and wife was given discharge instructions. Both verbalized understanding. 

## 2022-08-07 NOTE — H&P (Signed)
Electrophysiology Office Follow up Visit Note:     Date:  08/07/2022    ID:  Johnathan Hart, DOB 04-08-1980, MRN [id number]   PCP:  Jerl Mina, MD      [facility] Cardiologist:  None  [facility] Electrophysiologist:  Lanier Prude, MD      Interval History:     Johnathan Hart is a 42 y.o. male who presents for a follow up visit.    I last saw the patient April 15, 2022 for SVT.  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  The patient reported palpitations at night during her last appointment.  I ordered a 2-week ZIO monitor at the last appointment.   He is doing well today.  He tells me he continues to have episodes of palpitations and SVT.  Sometimes he has to gag himself to get the SVT to stop.  He does tell me that they are slightly different than the SVT that led to his prior ablation greater than 10 years ago at [facility].   Presents today for EP study with possible ablation. Reports continued episodes although in the past month they have lessened in frequency.   Objective  Past medical, surgical, social and family history were reviewed.   ROS:   Please see the history of present illness.    All other systems reviewed and are negative.   EKGs/Labs/Other Studies Reviewed:     The following studies were reviewed today:   May 12, 2022 ZIO monitor personally reviewed 15 SVT episodes, longest 58 seconds with an average heart rate of 178 bpm.  Symptom triggers corresponded to SVT episodes         Physical Exam:     VS:  BP 110/62   Pulse 96   Ht 5\' 8"  (1.727 m)   Wt 208 lb (94.3 kg)   BMI 31.63 kg/m         Wt Readings from Last 3 Encounters:  05/27/22 208 lb (94.3 kg)  04/15/22 204 lb (92.5 kg)      GEN:  Well nourished, well developed in no acute distress CARDIAC: RRR, no murmurs, rubs, gallops RESPIRATORY:  Clear to auscultation without rales, wheezing or rhonchi           Assessment ASSESSMENT:     1. SVT (supraventricular tachycardia)     PLAN:     In order of problems listed above:   #SVT The patient is had 2 prior SVT ablations at [facility] for ORT with a left lateral pathway targeted.  He has had recurrence of NSVT on his most recent ZIO monitor.  We discussed the possibility that this was the same pathway that had recurred or a different SVT altogether.  We discussed how the only way to know for sure would be to do an EP study.  We also discussed using flecainide or propafenone to help control the arrhythmia.  We also discussed continue with conservative strategy with watchful waiting.  He is not interested in waiting given the frequent symptoms.  He is also not interested in prolonged medication use given his relatively young age.  I discussed the EP study and ablation procedure in detail with the patient including the risks, recovery and likelihood of success.  He would like to proceed with scheduling.  He will need an echo before the procedure.   Risk, benefits, and alternatives to EP study and radiofrequency ablation for SVT were  also discussed in detail today. These risks include but are not limited to complete heart block, stroke, bleeding, vascular damage, tamponade, perforation, and death. The patient understands these risk and wishes to proceed.  We will therefore proceed with catheter ablation at the next available time.  Carto and ICE and anaesthesia are requested for the procedure.       Presents for EPS with possible ablation today. Procedure reviewed.        Signed, Steffanie Dunn, MD, [facility], [facility] 08/07/2022 Electrophysiology [facility]

## 2022-08-07 NOTE — Anesthesia Procedure Notes (Signed)
Procedure Name: Intubation Date/Time: 08/07/2022 12:13 PM  Performed by: Marena Chancy, CRNAPre-anesthesia Checklist: Patient identified, Emergency Drugs available, Suction available and Patient being monitored Patient Re-evaluated:Patient Re-evaluated prior to induction Oxygen Delivery Method: Circle System Utilized Preoxygenation: Pre-oxygenation with 100% oxygen Induction Type: IV induction Ventilation: Mask ventilation without difficulty Laryngoscope Size: Miller and 2 Tube type: Oral Tube size: 7.5 mm Number of attempts: 1 Airway Equipment and Method: Stylet and Oral airway Placement Confirmation: ETT inserted through vocal cords under direct vision, positive ETCO2 and breath sounds checked- equal and bilateral Tube secured with: Tape Dental Injury: Teeth and Oropharynx as per pre-operative assessment

## 2022-08-10 NOTE — Progress Notes (Unsigned)
Cardiology Office Note:   Date:  08/12/2022  NAME:  Johnathan Hart    MRN: [id number] DOB:  [birth year]   PCP:  Jerl Mina, MD  Cardiologist:  None  Electrophysiologist:  Lanier Prude, MD   Referring MD: Lanier Prude, MD   Chief Complaint  Patient presents with   Congestive Heart Failure   History of Present Illness:   Johnathan Hart is a 42 y.o. male with a hx of SVT who is being seen today for the evaluation of LBBB/LV dysfunction at the request of Jerl Mina, MD. he has had a history of SVT.  Ablation x 2 at [facility] in 2011.  Actually had recurrence of SVT in January.  Evaluated by Steffanie Dunn.  Attempted repeat SVT ablation but this was unsuccessful as he was unable to actually incite the arrhythmia.  An echocardiogram was performed in April of this year which did show LV dysfunction of 40 to 45%.  Global hypokinesis noted.  He does have a left bundle branch block.  QRS 148 ms.  Up to 154 on other tracings.  He did not have a left bundle branch block and 2011.  He reports no symptoms of heart failure.  Denies any chest pains or trouble breathing.  He is euvolemic on examination.  He reports his father may have SVT as well.  No history of CAD or heart failure in the family.  Mom passed from dementia.  Dad is still living.  He reports his SVT episodes have lessened.  He was having them quite frequently.  Heart rate can get up very fast.  He is not having any more SVT and if he does they are very infrequent.  His echocardiogram was performed when he was having very frequent SVT episodes.  I do wonder if dyssynchrony is at play here.  He needs a TSH checked.  All of his other lab work is within limits.  Problem List SVT -ORT s/p ablation x 2 @ [facility] in 2011 2. Systolic HF -EF 40-45% 07/02/2022 -CAC=0 3. LBBB -QRS 154 ms  Past Medical History: Past Medical History:  Diagnosis Date   Arrhythmia    Hyperlipidemia    SVT (supraventricular tachycardia)     Past Surgical  History: Past Surgical History:  Procedure Laterality Date   SVT ABLATION     SVT ABLATION N/A 08/07/2022   Procedure: SVT ABLATION;  Surgeon: Lanier Prude, MD;  Location: [facility] INVASIVE CV LAB;  Service: Cardiovascular;  Laterality: N/A;    Current Medications: Current Meds  Medication Sig   EPINEPHrine (EPIPEN 2-PAK) 0.3 mg/0.3 mL IJ SOAJ injection Inject 0.3 mg into the muscle as needed for anaphylaxis.   fluticasone (FLONASE) 50 MCG/ACT nasal spray Place 1 spray into both nostrils daily.   loratadine (CLARITIN) 10 MG tablet Take 10 mg by mouth daily.   Multiple Vitamin (MULTIVITAMIN) capsule Take 1 capsule by mouth daily.   Omega-3 1400 MG CAPS Take 1,400 mg by mouth daily.     Allergies:    Minocycline and Pineapple   Social History: Social History   Socioeconomic History   Marital status: Married    Spouse name: Not on file   Number of children: 2   Years of education: Not on file   Highest education level: Not on file  Occupational History   Occupation: Air cabin crew  Tobacco Use   Smoking status: Never   Smokeless tobacco: Never  Vaping Use   Vaping Use: Never used  Substance  and Sexual Activity   Alcohol use: Never   Drug use: Never   Sexual activity: Yes  Other Topics Concern   Not on file  Social History Narrative   Not on file   Social Determinants of Health   Financial Resource Strain: Not on file  Food Insecurity: Not on file  Transportation Needs: Not on file  Physical Activity: Not on file  Stress: Not on file  Social Connections: Not on file     Family History: The patient's family history includes Arrhythmia in his father; Dementia in his mother.  ROS:   All other ROS reviewed and negative. Pertinent positives noted in the HPI.     EKGs/Labs/Other Studies Reviewed:   The following studies were personally reviewed by me today:  EKG:  EKG is ordered today.  The ekg ordered today demonstrates normal sinus rhythm heart rate 84,  left bundle branch block, QRS 148 ms, and was personally reviewed by me.   Recent Labs: 07/20/2022: BUN 11; Creatinine, Ser 0.92; Hemoglobin 14.1; Platelets 216; Potassium 4.2; Sodium 140   Recent Lipid Panel No results found for: "CHOL", "TRIG", "HDL", "CHOLHDL", "VLDL", "LDLCALC", "LDLDIRECT"  Physical Exam:   VS:  BP 120/82   Pulse 84   Ht 5\' 8"  (1.727 m)   Wt 206 lb 6.4 oz (93.6 kg)   SpO2 100%   BMI 31.38 kg/m    Wt Readings from Last 3 Encounters:  08/12/22 206 lb 6.4 oz (93.6 kg)  08/07/22 200 lb (90.7 kg)  05/27/22 208 lb (94.3 kg)    General: Well nourished, well developed, in no acute distress Head: Atraumatic, normal size  Eyes: PEERLA, EOMI  Neck: Supple, no JVD Endocrine: No thryomegaly Cardiac: Normal S1, S2; RRR; no murmurs, rubs, or gallops Lungs: Clear to auscultation bilaterally, no wheezing, rhonchi or rales  Abd: Soft, nontender, no hepatomegaly  Ext: No edema, pulses 2+ Musculoskeletal: No deformities, BUE and BLE strength normal and equal Skin: Warm and dry, no rashes   Neuro: Alert and oriented to person, place, time, and situation, CNII-XII grossly intact, no focal deficits  Psych: Normal mood and affect   ASSESSMENT:   Johnathan Hart is a 42 y.o. male who presents for the following: 1. LV dysfunction   2. SVT (supraventricular tachycardia)   3. LBBB (left bundle branch block)     PLAN:   1. LV dysfunction 2. SVT (supraventricular tachycardia) 3. LBBB -Diagnosed with subclinical LV dysfunction.  This occurred when he was having very frequent and rapid SVT.  He also has left bundle branch block.  I do wonder if this is partially arrhythmia mediated in the setting of a left bundle branch block.  He clearly has dyssynchrony on his echo.  He really has no symptoms of congestive heart failure.  He needs a TSH today.  No clinical heart failure.  We discussed that it may be prudent to repeat a limited echocardiogram.  Since he is not having any SVT his heart  function may be quite normal.  We did discuss that treatment would include heart failure medications if it is not improved as well as a cardiac MRI.  It is really unclear why he would have LV dysfunction.  I do wonder if LV dyssynchrony from left bundle branch block is an issue.  This is the only readily identifiable explanation today.  Disposition: Return in about 3 months (around 11/12/2022).  Medication Adjustments/Labs and Tests Ordered: Current medicines are reviewed at length with the patient today.  Concerns regarding medicines are outlined above.  Orders Placed This Encounter  Procedures   TSH   EKG 12-Lead   ECHOCARDIOGRAM LIMITED   No orders of the defined types were placed in this encounter.   Patient Instructions  Medication Instructions:  The current medical regimen is effective;  continue present plan and medications.  *If you need a refill on your cardiac medications before your next appointment, please call your pharmacy*   Lab Work: TSH today   If you have labs (blood work) drawn today and your tests are completely normal, you will receive your results only by: MyChart Message (if you have MyChart) OR A paper copy in the mail If you have any lab test that is abnormal or we need to change your treatment, we will call you to review the results.   Testing/Procedures:   LIMITED Echocardiogram - Your physician has requested that you have an echocardiogram. Echocardiography is a painless test that uses sound waves to create images of your heart. It provides your doctor with information about the size and shape of your heart and how well your heart's chambers and valves are working. This procedure takes approximately one hour. There are no restrictions for this procedure.    Follow-Up: At [facility], you and your health needs are our priority.  As part of our continuing mission to provide you with exceptional heart care, we have created designated Provider  Care Teams.  These Care Teams include your primary Cardiologist (physician) and Advanced Practice Providers (APPs -  Physician Assistants and Nurse Practitioners) who all work together to provide you with the care you need, when you need it.  We recommend signing up for the patient portal called "MyChart".  Sign up information is provided on this After Visit Summary.  MyChart is used to connect with patients for Virtual Visits (Telemedicine).  Patients are able to view lab/test results, encounter notes, upcoming appointments, etc.  Non-urgent messages can be sent to your provider as well.   To learn more about what you can do with MyChart, go to [email or web address].    Your next appointment:   3 month(s)  Provider:   Lennie Odor, MD      Signed, Lenna Gilford. Flora Lipps, MD, [facility]  [facility]  [address], [address] [place], [place] [address] [phone]  08/12/2022 9:37 AM

## 2022-08-11 ENCOUNTER — Encounter (HOSPITAL_COMMUNITY): Payer: Self-pay | Admitting: Cardiology

## 2022-08-12 ENCOUNTER — Encounter: Payer: Self-pay | Admitting: Cardiovascular Disease

## 2022-08-12 ENCOUNTER — Ambulatory Visit: Payer: Managed Care, Other (non HMO) | Attending: Cardiovascular Disease | Admitting: Cardiovascular Disease

## 2022-08-12 VITALS — BP 120/82 | HR 84 | Ht 68.0 in | Wt 206.4 lb

## 2022-08-12 DIAGNOSIS — I447 Left bundle-branch block, unspecified: Secondary | ICD-10-CM

## 2022-08-12 DIAGNOSIS — I471 Supraventricular tachycardia, unspecified: Secondary | ICD-10-CM | POA: Diagnosis not present

## 2022-08-12 DIAGNOSIS — I519 Heart disease, unspecified: Secondary | ICD-10-CM | POA: Diagnosis not present

## 2022-08-12 DIAGNOSIS — I5022 Chronic systolic (congestive) heart failure: Secondary | ICD-10-CM

## 2022-08-12 NOTE — Patient Instructions (Addendum)
Medication Instructions:  The current medical regimen is effective;  continue present plan and medications.  *If you need a refill on your cardiac medications before your next appointment, please call your pharmacy*   Lab Work: TSH today   If you have labs (blood work) drawn today and your tests are completely normal, you will receive your results only by: MyChart Message (if you have MyChart) OR A paper copy in the mail If you have any lab test that is abnormal or we need to change your treatment, we will call you to review the results.   Testing/Procedures:   LIMITED Echocardiogram - Your physician has requested that you have an echocardiogram. Echocardiography is a painless test that uses sound waves to create images of your heart. It provides your doctor with information about the size and shape of your heart and how well your heart's chambers and valves are working. This procedure takes approximately one hour. There are no restrictions for this procedure.    Follow-Up: At [facility], you and your health needs are our priority.  As part of our continuing mission to provide you with exceptional heart care, we have created designated Provider Care Teams.  These Care Teams include your primary Cardiologist (physician) and Advanced Practice Providers (APPs -  Physician Assistants and Nurse Practitioners) who all work together to provide you with the care you need, when you need it.  We recommend signing up for the patient portal called "MyChart".  Sign up information is provided on this After Visit Summary.  MyChart is used to connect with patients for Virtual Visits (Telemedicine).  Patients are able to view lab/test results, encounter notes, upcoming appointments, etc.  Non-urgent messages can be sent to your provider as well.   To learn more about what you can do with MyChart, go to [email or web address].    Your next appointment:   3 month(s)  Provider:   Lennie Odor,  MD

## 2022-08-13 LAB — TSH: TSH: 1.27 u[IU]/mL (ref 0.450–4.500)

## 2022-09-09 ENCOUNTER — Ambulatory Visit: Payer: Managed Care, Other (non HMO) | Admitting: Cardiology

## 2022-09-09 NOTE — Progress Notes (Unsigned)
  Electrophysiology Office Follow up Visit Note:    Date:  09/10/2022   ID:  Johnathan Hart, DOB 06/03/1980, MRN [id number]  PCP:  Jerl Mina, MD  [facility] Cardiologist:  None  [facility] Electrophysiologist:  Lanier Prude, MD    Interval History:    Johnathan Hart is a 42 y.o. male who presents for a follow up visit.   Last seen 05/27/2022. He had an EP study 08/07/2022 for SVT. During the procedure, no tachycardias were inducible. Had 2 prior SVT ablations at [facility]. Echo during this time reduced with EF 40-45%.  Dr Flora Lipps has seen the patient recently. He believes dyssynchrony and frequent SVT led to the prior reduced LV function. Now that his burden of SVT is down, a limited echo is planned.  He has done well since I last saw him.  No sustained episodes.  Brief flutters of palpitations lasting several seconds at a time.  Has a repeat echo scheduled with Dr. Flora Lipps.     Past medical, surgical, social and family history were reviewed.  ROS:   Please see the history of present illness.    All other systems reviewed and are negative.  EKGs/Labs/Other Studies Reviewed:    The following studies were reviewed today:  08/12/2022 ECG shows sinus, LBBB        Physical Exam:    VS:  BP 102/80   Pulse 82   Ht 5\' 8"  (1.727 m)   Wt 204 lb 3.2 oz (92.6 kg)   SpO2 98%   BMI 31.05 kg/m     Wt Readings from Last 3 Encounters:  09/10/22 204 lb 3.2 oz (92.6 kg)  08/12/22 206 lb 6.4 oz (93.6 kg)  08/07/22 200 lb (90.7 kg)     GEN:  Well nourished, well developed in no acute distress CARDIAC: RRR, no murmurs, rubs, gallops RESPIRATORY:  Clear to auscultation without rales, wheezing or rhonchi       ASSESSMENT:    1. SVT (supraventricular tachycardia)   2. LV dysfunction   3. LBBB (left bundle branch block)    PLAN:    In order of problems listed above:   #SVT Burden of arrhythmia has diminished. SVT was not inducible during recent EP study. Continue to  monitor for now.  #Reduced LV function EF 40-45%. Likely related to dyssynchrony. No symptoms of HF at this time. Follow up with Dr Flora Lipps.  Follow up with EP APP in 12 months or sooner PRN.    Signed, Steffanie Dunn, MD, [facility], [facility] 09/10/2022 8:29 AM    Electrophysiology [facility]

## 2022-09-10 ENCOUNTER — Ambulatory Visit (HOSPITAL_COMMUNITY): Payer: Managed Care, Other (non HMO) | Attending: Cardiology | Admitting: Cardiology

## 2022-09-10 ENCOUNTER — Encounter: Payer: Self-pay | Admitting: Cardiology

## 2022-09-10 VITALS — BP 102/80 | HR 82 | Ht 68.0 in | Wt 204.2 lb

## 2022-09-10 DIAGNOSIS — I519 Heart disease, unspecified: Secondary | ICD-10-CM | POA: Insufficient documentation

## 2022-09-10 DIAGNOSIS — I447 Left bundle-branch block, unspecified: Secondary | ICD-10-CM | POA: Insufficient documentation

## 2022-09-10 DIAGNOSIS — I471 Supraventricular tachycardia, unspecified: Secondary | ICD-10-CM | POA: Insufficient documentation

## 2022-09-10 NOTE — Patient Instructions (Signed)
Medication Instructions:  Your physician recommends that you continue on your current medications as directed. Please refer to the Current Medication list given to you today.  *If you need a refill on your cardiac medications before your next appointment, please call your pharmacy*  Follow-Up: At [facility], you and your health needs are our priority.  As part of our continuing mission to provide you with exceptional heart care, we have created designated Provider Care Teams.  These Care Teams include your primary Cardiologist (physician) and Advanced Practice Providers (APPs -  Physician Assistants and Nurse Practitioners) who all work together to provide you with the care you need, when you need it.  Your next appointment:   1 year(s)  Provider:   Sherie Don, NP    Other Instructions Kardia for monitoring of EKGs at home: You can look into the [employer] [facility] device by [employer].This device is purchased by you and it connects to an application you download to your smart phone.  It can detect abnormal heart rhythms and alert you to contact your doctor for further evaluation. The device is approximately $90 and the phone application is free.  The web site is:  [email or web address]://www.alivecor.com

## 2022-09-11 ENCOUNTER — Ambulatory Visit (HOSPITAL_BASED_OUTPATIENT_CLINIC_OR_DEPARTMENT_OTHER): Payer: Managed Care, Other (non HMO)

## 2022-09-11 DIAGNOSIS — I361 Nonrheumatic tricuspid (valve) insufficiency: Secondary | ICD-10-CM

## 2022-09-11 DIAGNOSIS — I447 Left bundle-branch block, unspecified: Secondary | ICD-10-CM | POA: Diagnosis present

## 2022-09-11 DIAGNOSIS — I519 Heart disease, unspecified: Secondary | ICD-10-CM | POA: Diagnosis present

## 2022-09-11 DIAGNOSIS — I471 Supraventricular tachycardia, unspecified: Secondary | ICD-10-CM | POA: Diagnosis present

## 2022-09-11 LAB — ECHOCARDIOGRAM LIMITED
Area-P 1/2: 4.89 cm2
S' Lateral: 3.3 cm

## 2022-09-11 MED ORDER — PERFLUTREN LIPID MICROSPHERE
1.0000 mL | INTRAVENOUS | Status: AC | PRN
Start: 2022-09-11 — End: 2022-09-11
  Administered 2022-09-11: 2 mL via INTRAVENOUS

## 2022-11-02 NOTE — Progress Notes (Unsigned)
Cardiology Office Note:   Date:  11/03/2022  NAME:  [name]    MRN: [id number] DOB:  1980/04/14   PCP:  Jerl Mina, MD  Cardiologist:  Reatha Harps, MD  Electrophysiologist:  Lanier Prude, MD   Referring MD: Jerl Mina, MD   Chief Complaint  Patient presents with   Follow-up         History of Present Illness:   Johnathan Hart is a 42 y.o. male with a hx of SVT, LBBB, LV dysfunction who presents for follow-up.  Ejection fraction has recovered.  We discussed this is likely arrhythmia related.  He never really had clinical heart failure.  Does not need treatment.  He is being followed by electrophysiology for his arrhythmia.  Seems to be quiescent.  May notice infrequent palpitations.  CV exam normal.  Denies any symptoms in office.  Problem List SVT -ORT s/p ablation x 2 @ [facility] in 2011 2. Systolic HF -EF 40-45% 07/02/2022 -EF 55-60% 09/11/2022 -CAC=0 3. LBBB -QRS 154 ms  Past Medical History: Past Medical History:  Diagnosis Date   Arrhythmia    Hyperlipidemia    SVT (supraventricular tachycardia)     Past Surgical History: Past Surgical History:  Procedure Laterality Date   SVT ABLATION     SVT ABLATION N/A 08/07/2022   Procedure: SVT ABLATION;  Surgeon: Lanier Prude, MD;  Location: [facility];  Service: Cardiovascular;  Laterality: N/A;    Current Medications: Current Meds  Medication Sig   EPINEPHrine (EPIPEN 2-PAK) 0.3 mg/0.3 mL IJ SOAJ injection Inject 0.3 mg into the muscle as needed for anaphylaxis.   fluticasone (FLONASE) 50 MCG/ACT nasal spray Place 1 spray into both nostrils daily.   loratadine (CLARITIN) 10 MG tablet Take 10 mg by mouth daily.   Multiple Vitamin (MULTIVITAMIN) capsule Take 1 capsule by mouth daily.   Omega-3 1400 MG CAPS Take 1,400 mg by mouth daily.     Allergies:    Minocycline and Pineapple   Social History: Social History   Socioeconomic History   Marital status: Married    Spouse name: Not on  file   Number of children: 2   Years of education: Not on file   Highest education level: Not on file  Occupational History   Occupation: Air cabin crew  Tobacco Use   Smoking status: Never   Smokeless tobacco: Never  Vaping Use   Vaping status: Never Used  Substance and Sexual Activity   Alcohol use: Never   Drug use: Never   Sexual activity: Yes  Other Topics Concern   Not on file  Social History Narrative   Not on file   Social Determinants of Health   Financial Resource Strain: Not on file  Food Insecurity: Not on file  Transportation Needs: Not on file  Physical Activity: Not on file  Stress: Not on file  Social Connections: Not on file     Family History: The patient's family history includes Arrhythmia in his father; Dementia in his mother.  ROS:   All other ROS reviewed and negative. Pertinent positives noted in the HPI.     EKGs/Labs/Other Studies Reviewed:   The following studies were personally reviewed by me today:  EKG:  EKG is not ordered today.        Recent Labs: 07/20/2022: BUN 11; Creatinine, Ser 0.92; Hemoglobin 14.1; Platelets 216; Potassium 4.2; Sodium 140 08/12/2022: TSH 1.270   Recent Lipid Panel No results found for: "CHOL", "TRIG", "HDL", "  CHOLHDL", "VLDL", "LDLCALC", "LDLDIRECT"  Physical Exam:   VS:  BP 120/88 (BP Location: Left Arm, Patient Position: Sitting, Cuff Size: Normal)   Pulse 80   Ht 5\' 8"  (1.727 m)   Wt 205 lb 9.6 oz (93.3 kg)   SpO2 96%   BMI 31.26 kg/m    Wt Readings from Last 3 Encounters:  11/03/22 205 lb 9.6 oz (93.3 kg)  09/10/22 204 lb 3.2 oz (92.6 kg)  08/12/22 206 lb 6.4 oz (93.6 kg)    General: Well nourished, well developed, in no acute distress Head: Atraumatic, normal size  Eyes: PEERLA, EOMI  Neck: Supple, no JVD Endocrine: No thryomegaly Cardiac: Normal S1, S2; RRR; no murmurs, rubs, or gallops Lungs: Clear to auscultation bilaterally, no wheezing, rhonchi or rales  Abd: Soft, nontender, no  hepatomegaly  Ext: No edema, pulses 2+ Musculoskeletal: No deformities, BUE and BLE strength normal and equal Skin: Warm and dry, no rashes   Neuro: Alert and oriented to person, place, time, and situation, CNII-XII grossly intact, no focal deficits  Psych: Normal mood and affect   ASSESSMENT:   [name] is a 41 y.o. male who presents for the following: 1. SVT (supraventricular tachycardia)   2. LV dysfunction   3. LBBB (left bundle branch block)     PLAN:   1. SVT (supraventricular tachycardia) 2. LV dysfunction 3. LBBB (left bundle branch block) -Known history of left bundle branch block.  Had reduced ejection fraction without symptoms.  EF was 45%.  Suspect this was related to arrhythmia in the setting of left bundle branch block.  Arrhythmia is now quiescent and ejection fraction 55 to 60%.  No clinical symptoms of heart failure.  Would not recommend any treatment.  He is quite young and healthy.  As long as his arrhythmia is treated his ejection fraction seems to be stable.  He can see me back as needed.  He will follow-up with EP.  We are available if needed.   Disposition: Return if symptoms worsen or fail to improve.  Medication Adjustments/Labs and Tests Ordered: Current medicines are reviewed at length with the patient today.  Concerns regarding medicines are outlined above.  No orders of the defined types were placed in this encounter.  No orders of the defined types were placed in this encounter.  Patient Instructions  Medication Instructions:  The current medical regimen is effective;  continue present plan and medications as directed. Please refer to the Current Medication list given to you today.  *If you need a refill on your cardiac medications before your next appointment, please call your pharmacy*  Lab Work: NONE If you have labs (blood work) drawn today and your tests are completely normal, you will receive your results only by:  MyChart Message (if you have  MyChart) OR  A paper copy in the mail If you have any lab test that is abnormal or we need to change your treatment, we will call you to review the results.  Testing/Procedures: NONE  Follow-Up: At [facility], you and your health needs are our priority.  As part of our continuing mission to provide you with exceptional heart care, we have created designated Provider Care Teams.  These Care Teams include your primary Cardiologist (physician) and Advanced Practice Providers (APPs -  Physician Assistants and Nurse Practitioners) who all work together to provide you with the care you need, when you need it.  Your next appointment:   AS NEEDED   Provider:  Reatha Harps, MD     Other Instructions CONTINUE TO FOLLOW UP WITH DR Lalla Brothers   Time Spent with Patient: I have spent a total of 25 minutes with patient reviewing hospital notes, telemetry, EKGs, labs and examining the patient as well as establishing an assessment and plan that was discussed with the patient.  > 50% of time was spent in direct patient care.  Signed, Lenna Gilford. Flora Lipps, MD, [facility]  [facility]  [address], [address] [place], [place] [address] [phone]  11/03/2022 9:23 AM

## 2022-11-03 ENCOUNTER — Ambulatory Visit: Payer: Managed Care, Other (non HMO) | Attending: Cardiovascular Disease | Admitting: Cardiovascular Disease

## 2022-11-03 ENCOUNTER — Encounter: Payer: Self-pay | Admitting: Cardiovascular Disease

## 2022-11-03 VITALS — BP 120/88 | HR 80 | Ht 68.0 in | Wt 205.6 lb

## 2022-11-03 DIAGNOSIS — I447 Left bundle-branch block, unspecified: Secondary | ICD-10-CM

## 2022-11-03 DIAGNOSIS — I471 Supraventricular tachycardia, unspecified: Secondary | ICD-10-CM | POA: Diagnosis not present

## 2022-11-03 DIAGNOSIS — I519 Heart disease, unspecified: Secondary | ICD-10-CM | POA: Diagnosis not present

## 2022-11-03 NOTE — Patient Instructions (Addendum)
Medication Instructions:  The current medical regimen is effective;  continue present plan and medications as directed. Please refer to the Current Medication list given to you today.  *If you need a refill on your cardiac medications before your next appointment, please call your pharmacy*  Lab Work: NONE If you have labs (blood work) drawn today and your tests are completely normal, you will receive your results only by:  MyChart Message (if you have MyChart) OR  A paper copy in the mail If you have any lab test that is abnormal or we need to change your treatment, we will call you to review the results.  Testing/Procedures: NONE  Follow-Up: At [facility], you and your health needs are our priority.  As part of our continuing mission to provide you with exceptional heart care, we have created designated Provider Care Teams.  These Care Teams include your primary Cardiologist (physician) and Advanced Practice Providers (APPs -  Physician Assistants and Nurse Practitioners) who all work together to provide you with the care you need, when you need it.  Your next appointment:   AS NEEDED   Provider:   Reatha Harps, MD     Other Instructions CONTINUE TO FOLLOW UP WITH DR Lalla Brothers
